# Patient Record
Sex: Male | Born: 1995 | Race: White | Hispanic: No | Marital: Single
Health system: Southern US, Community
[De-identification: ages and names within clinical notes are randomized; demographics above are authoritative.]

---
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
## 2013-12-16 ENCOUNTER — Other Ambulatory Visit: Payer: Self-pay | Admitting: Gastroenterology

## 2013-12-16 DIAGNOSIS — R1013 Epigastric pain: Secondary | ICD-10-CM

## 2013-12-18 ENCOUNTER — Ambulatory Visit
Admission: RE | Admit: 2013-12-18 | Discharge: 2013-12-18 | Disposition: A | Discharge status: Home / Self Care | Payer: Commercial Managed Care - PPO | Source: Ambulatory Visit | Attending: Gastroenterology | Admitting: Gastroenterology

## 2013-12-18 DIAGNOSIS — R1013 Epigastric pain: Secondary | ICD-10-CM

## 2015-12-20 IMAGING — RF DG UGI W/ HIGH DENSITY W/KUB
19 of 24 series · 19 of 24 positions shown · non-contrast
Comparison: None.

CLINICAL DATA: Abdominal pain and dyspepsia. Prior episode of
hepatitis.

EXAM:
UPPER GI SERIES WITH KUB
TECHNIQUE: After obtaining a scout radiograph a routine upper GI series was
performed using thin and high density barium.
FLUOROSCOPY TIME:  2 min and 42 seconds

[Series 1: run · 1 of 1 slices shown (1 of 19)]
[im 1/1]
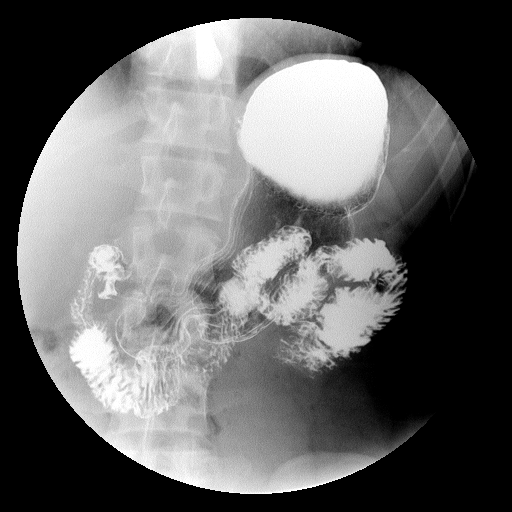

[Series 2: run · 1 of 1 slices shown (2 of 19)]
[im 1/1]
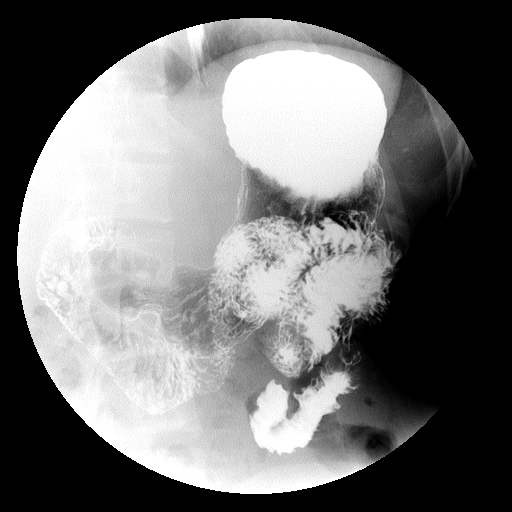

[Series 4: run · 1 of 1 slices shown (3 of 19)]
[im 1/1]
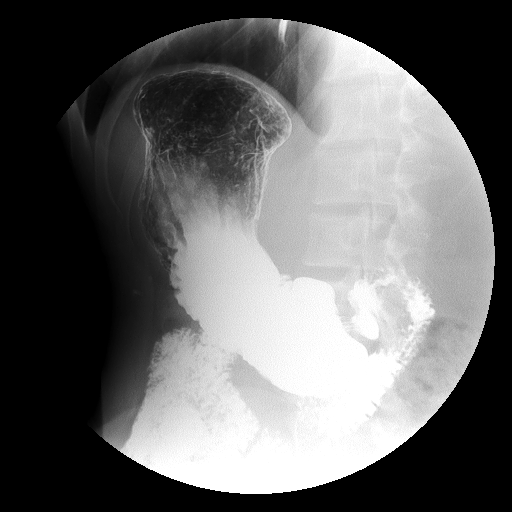

[Series 5: run · 1 of 1 slices shown (4 of 19)]
[im 1/1]
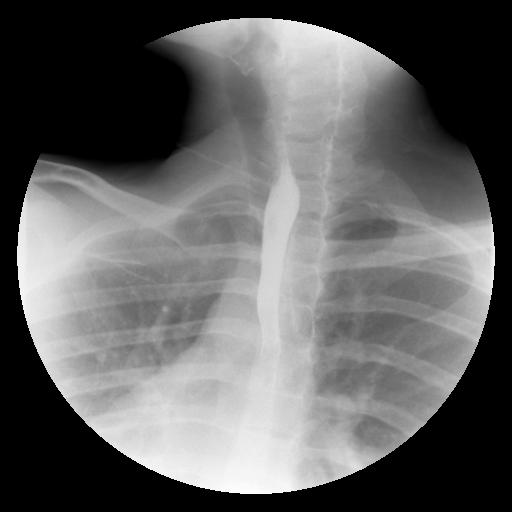

[Series 6: run · 1 of 1 slices shown (5 of 19)]
[im 1/1]
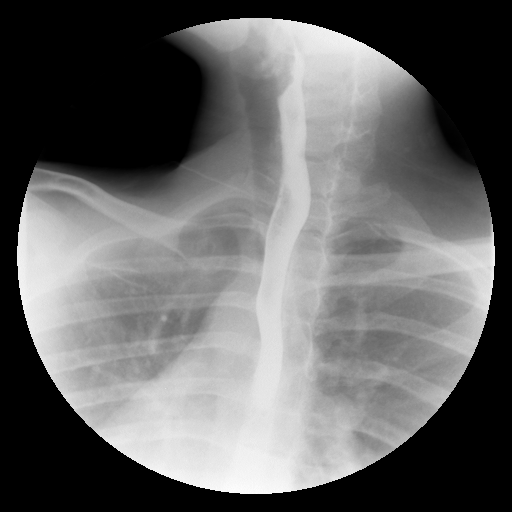

[Series 7: run · 1 of 1 slices shown (6 of 19)]
[im 1/1]
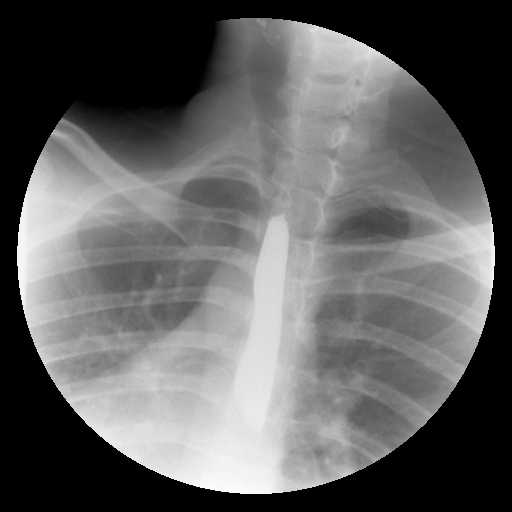

[Series 9: run · 1 of 1 slices shown (7 of 19)]
[im 1/1]
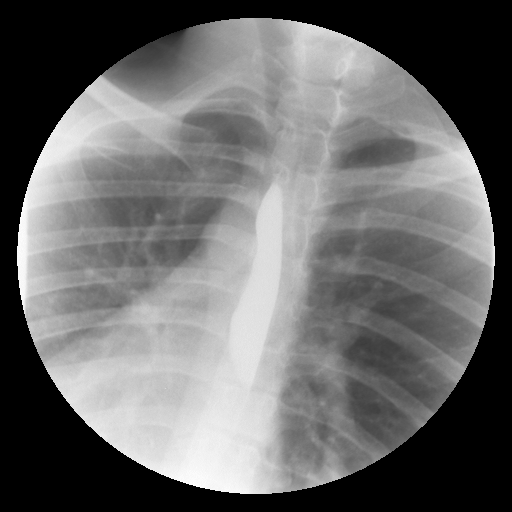

[Series 10: run · 1 of 1 slices shown (8 of 19)]
[im 1/1]
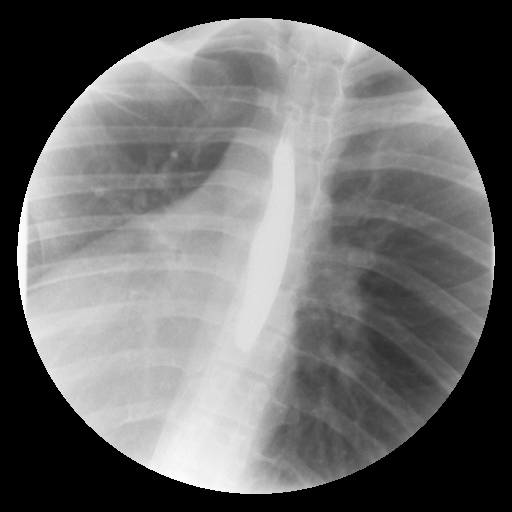

[Series 11: run · 1 of 1 slices shown (9 of 19)]
[im 1/1]
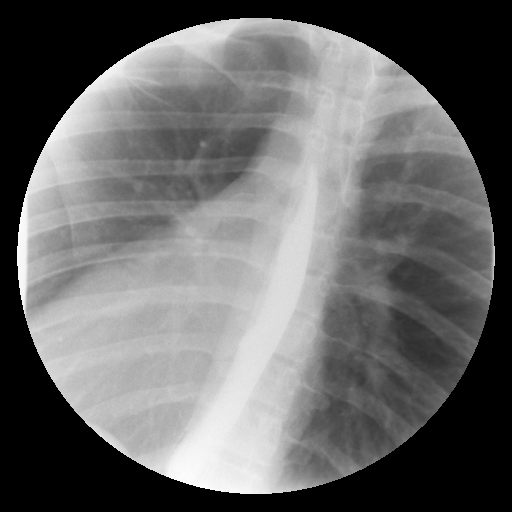

[Series 13: run · 1 of 1 slices shown (10 of 19)]
[im 1/1]
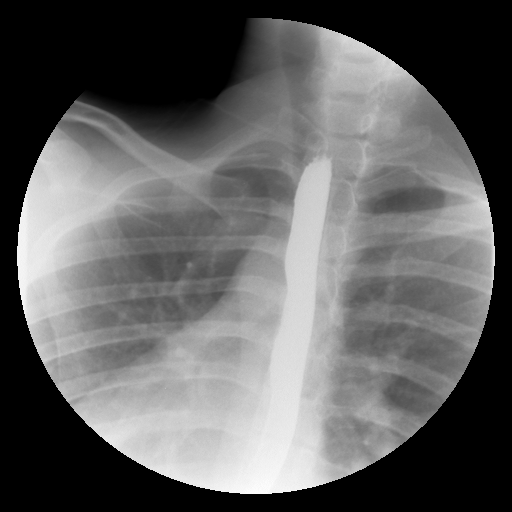

[Series 14: run · 1 of 1 slices shown (11 of 19)]
[im 1/1]
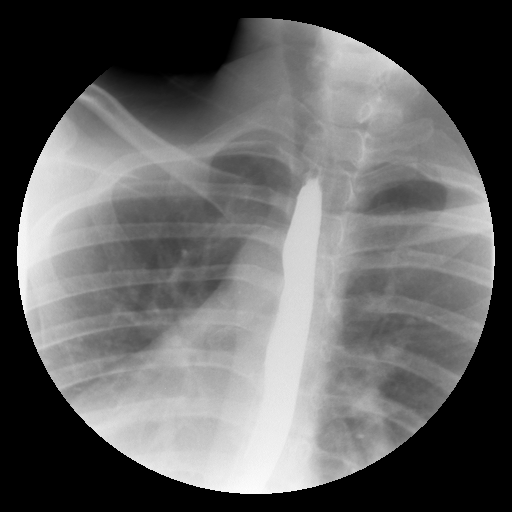

[Series 15: run · 1 of 1 slices shown (12 of 19)]
[im 1/1]
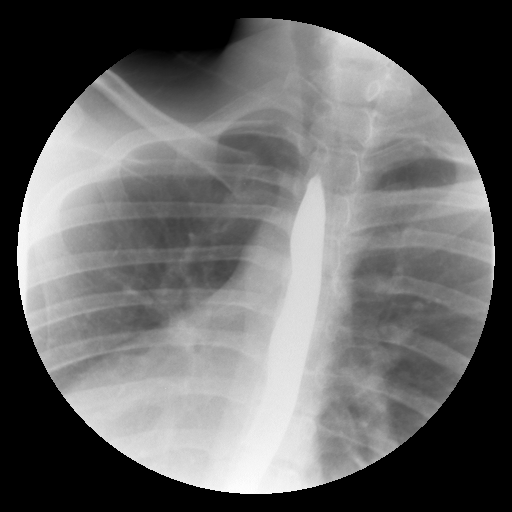

[Series 16: run · 1 of 1 slices shown (13 of 19)]
[im 1/1]
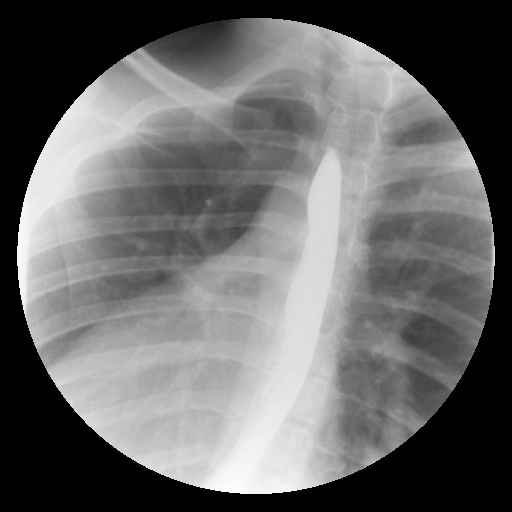

[Series 18: run · 1 of 1 slices shown (14 of 19)]
[im 1/1]
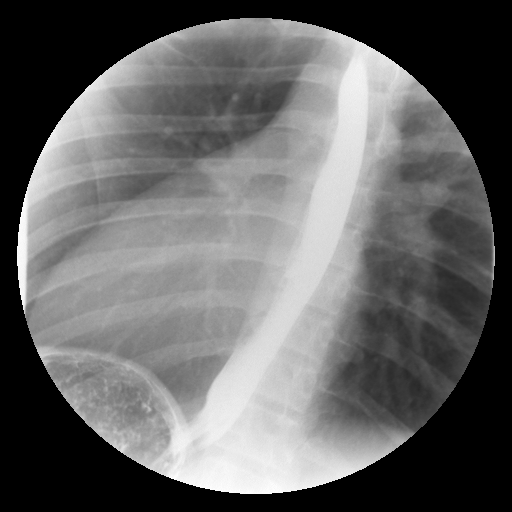

[Series 19: run · 1 of 1 slices shown (15 of 19)]
[im 1/1]
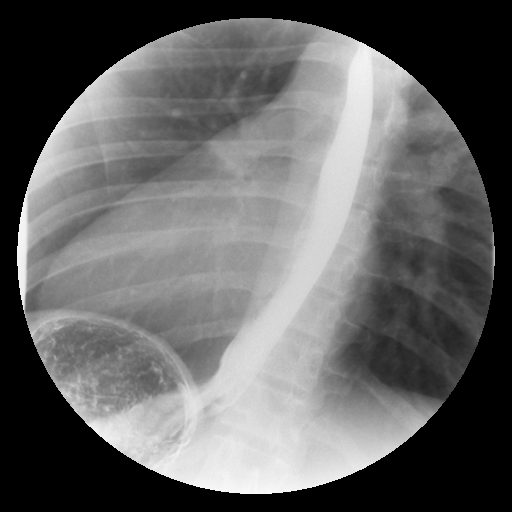

[Series 20: run · 1 of 1 slices shown (16 of 19)]
[im 1/1]
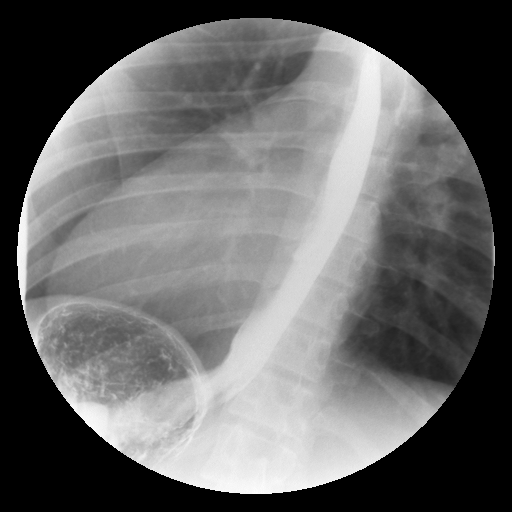

[Series 21: run · 1 of 1 slices shown (17 of 19)]
[im 1/1]
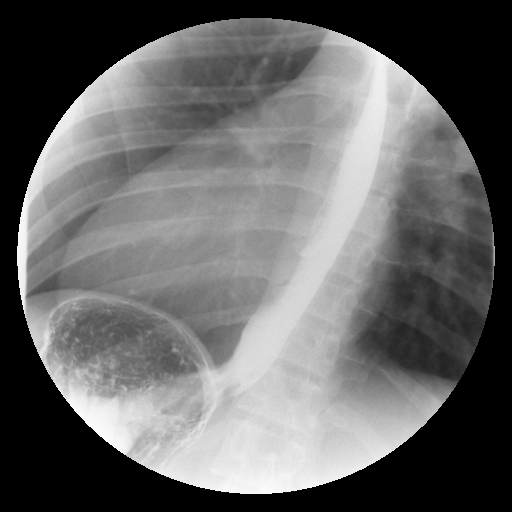

[Series 23: run · 1 of 1 slices shown (18 of 19)]
[im 1/1]
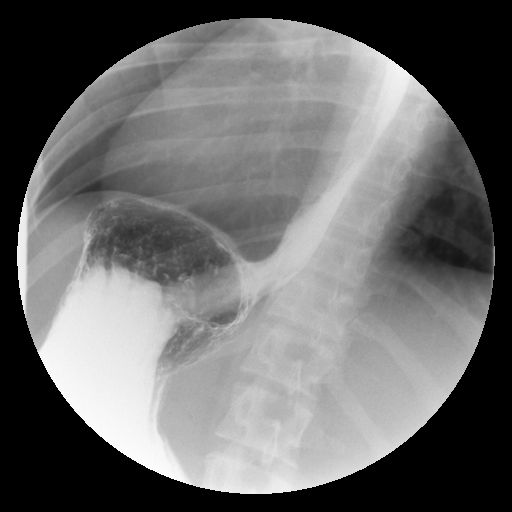

[Series 24: run · 1 of 1 slices shown (19 of 19)]
[im 1/1]
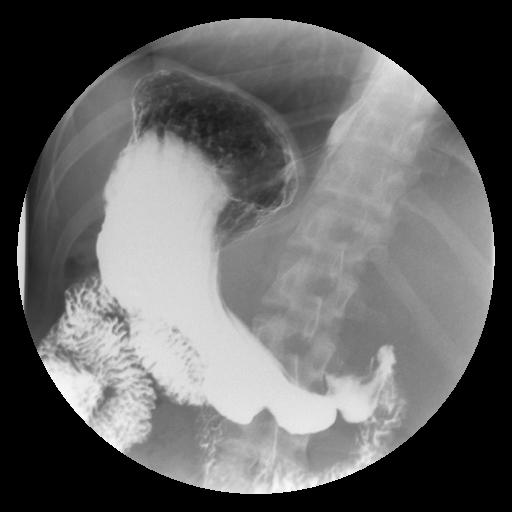

[19 of 24 positions shown; findings below may reference images not displayed]

FINDINGS: Preprocedure scout film demonstrates a nonobstructive bowel gas
pattern. No abnormal abdominal calcifications. No appendicolith.

Double contrast evaluation of the stomach demonstrates no mucosal
abnormality, mass, or ulcer.

Limited evaluation of esophageal motility is unremarkable.

Full column evaluation of the esophagus demonstrates no persistent
narrowing or stricture.

Normal appearance of the duodenal bulb and C-loop. Small volume
gastroesophageal reflux is incidentally noted. This is confirmed on
water siphon test, including on series 29. Normal caliber of
proximal small bowel loops without mucosal abnormality.
IMPRESSION: 1. Spontaneous small volume gastroesophageal reflux.
2. Otherwise, normal upper GI.
